# Patient Record
Sex: Female | Born: 1970
Health system: Southern US, Community
[De-identification: ages and names within clinical notes are randomized; demographics above are authoritative.]

## PROBLEM LIST (undated history)

## (undated) DIAGNOSIS — E663 Overweight: Secondary | ICD-10-CM

## (undated) DIAGNOSIS — R55 Syncope and collapse: Secondary | ICD-10-CM

## (undated) HISTORY — DX: Overweight: E66.3

## (undated) HISTORY — DX: Syncope and collapse: R55

---

## 1998-12-17 ENCOUNTER — Other Ambulatory Visit: Admission: RE | Admit: 1998-12-17 | Discharge: 1998-12-17 | Payer: Self-pay | Admitting: Gynecology

## 2000-02-16 ENCOUNTER — Other Ambulatory Visit: Admission: RE | Admit: 2000-02-16 | Discharge: 2000-02-16 | Payer: Self-pay | Admitting: Gynecology

## 2001-02-20 ENCOUNTER — Other Ambulatory Visit: Admission: RE | Admit: 2001-02-20 | Discharge: 2001-02-20 | Payer: Self-pay | Admitting: Gynecology

## 2001-11-08 ENCOUNTER — Encounter (INDEPENDENT_AMBULATORY_CARE_PROVIDER_SITE_OTHER): Payer: Self-pay | Admitting: Specialist

## 2001-11-08 ENCOUNTER — Inpatient Hospital Stay (HOSPITAL_COMMUNITY): Admission: AD | Admit: 2001-11-08 | Discharge: 2001-11-10 | Payer: Self-pay | Admitting: Gynecology

## 2001-11-14 ENCOUNTER — Encounter: Admission: RE | Admit: 2001-11-14 | Discharge: 2001-12-14 | Payer: Self-pay | Admitting: Gynecology

## 2001-12-15 ENCOUNTER — Encounter: Admission: RE | Admit: 2001-12-15 | Discharge: 2002-01-14 | Payer: Self-pay | Admitting: Gynecology

## 2001-12-27 ENCOUNTER — Other Ambulatory Visit: Admission: RE | Admit: 2001-12-27 | Discharge: 2001-12-27 | Payer: Self-pay | Admitting: Gynecology

## 2003-02-25 ENCOUNTER — Other Ambulatory Visit: Admission: RE | Admit: 2003-02-25 | Discharge: 2003-02-25 | Payer: Self-pay | Admitting: Gynecology

## 2003-09-27 ENCOUNTER — Inpatient Hospital Stay (HOSPITAL_COMMUNITY): Admission: AD | Admit: 2003-09-27 | Discharge: 2003-09-29 | Payer: Self-pay | Admitting: Gynecology

## 2003-11-24 ENCOUNTER — Other Ambulatory Visit: Admission: RE | Admit: 2003-11-24 | Discharge: 2003-11-24 | Payer: Self-pay | Admitting: Gynecology

## 2004-12-21 ENCOUNTER — Other Ambulatory Visit: Admission: RE | Admit: 2004-12-21 | Discharge: 2004-12-21 | Payer: Self-pay | Admitting: Gynecology

## 2007-07-19 ENCOUNTER — Ambulatory Visit (HOSPITAL_COMMUNITY): Admission: RE | Admit: 2007-07-19 | Discharge: 2007-07-19 | Payer: Self-pay | Admitting: Obstetrics and Gynecology

## 2010-08-19 NOTE — Discharge Summary (Signed)
   NAME:  Maria Peters, Maria Peters                        ACCOUNT NO.:  [id number]   MEDICAL RECORD NO.:  [id number]                   PATIENT TYPE:  INP   LOCATION:  9142                                 FACILITY:  [facility]   PHYSICIAN:  Timothy P. Fontaine, M.D.           DATE OF BIRTH:  08-09-1970   DATE OF ADMISSION:  11/08/2001  DATE OF DISCHARGE:  11/10/2001                                 DISCHARGE SUMMARY   DISCHARGE DIAGNOSES:  Intrauterine pregnancy at 40 weeks delivered, positive  group B Strep, status post spontaneous vaginal delivery on November 08, 2001 by  Dr. Colin Broach.   HISTORY OF PRESENT ILLNESS:  This 40 year old female gravida 1 with [facility]  November 07, 2001.  Prenatal course was uncomplicated.   HOSPITAL COURSE:  On November 08, 2001 patient admitted at 40 weeks with  rupture of membranes without contractions.  The patient was begun on IV  antibiotics for group B Strep prophylaxis and Pitocin was begun.  The  patient successfully on November 08, 2001 at 2203 underwent a spontaneous  vaginal delivery of a female Apgars of 9 and 9, weight of 7 pounds 3 ounces.  There was a midline episiotomy that was repaired without complications.  Postpartum patient remained afebrile, voiding, in stable condition.  She was  discharged home in satisfactory condition on November 10, 2001 and given  [facility] Gynecology postpartum instruction with postpartum booklet.   ACCESSORY CLINICAL FINDINGS:  Laboratories:  The patient's hemoglobin November 09, 2001 was 11.2.   DISPOSITION:  The patient was discharged to home to return in six weeks.  If  any problem prior to that time will be seen in the office.  Given a  prescription for Darvocet-N 100, # 20.     Susa Loffler, P.A.                    Timothy P. Audie Box, M.D.    TSG/MEDQ  D:  12/06/2001  T:  12/06/2001  Job:  [id number]

## 2010-08-30 ENCOUNTER — Other Ambulatory Visit (HOSPITAL_COMMUNITY): Payer: Self-pay | Admitting: Obstetrics and Gynecology

## 2010-08-30 DIAGNOSIS — Z1231 Encounter for screening mammogram for malignant neoplasm of breast: Secondary | ICD-10-CM

## 2010-09-08 ENCOUNTER — Ambulatory Visit (HOSPITAL_COMMUNITY)
Admission: RE | Admit: 2010-09-08 | Discharge: 2010-09-08 | Disposition: A | Discharge status: Home / Self Care | Payer: 59 | Source: Ambulatory Visit | Attending: Obstetrics and Gynecology | Admitting: Obstetrics and Gynecology

## 2010-09-08 DIAGNOSIS — Z1231 Encounter for screening mammogram for malignant neoplasm of breast: Secondary | ICD-10-CM | POA: Insufficient documentation

## 2013-01-29 ENCOUNTER — Other Ambulatory Visit: Payer: Self-pay | Admitting: Obstetrics and Gynecology

## 2013-01-29 DIAGNOSIS — Z1231 Encounter for screening mammogram for malignant neoplasm of breast: Secondary | ICD-10-CM

## 2013-02-14 ENCOUNTER — Ambulatory Visit (HOSPITAL_COMMUNITY): Payer: 59

## 2014-04-20 ENCOUNTER — Other Ambulatory Visit (HOSPITAL_COMMUNITY): Payer: Self-pay | Admitting: Obstetrics and Gynecology

## 2014-04-20 DIAGNOSIS — Z1231 Encounter for screening mammogram for malignant neoplasm of breast: Secondary | ICD-10-CM

## 2014-04-23 ENCOUNTER — Ambulatory Visit (HOSPITAL_COMMUNITY)
Admission: RE | Admit: 2014-04-23 | Discharge: 2014-04-23 | Disposition: A | Discharge status: Home / Self Care | Payer: 59 | Source: Ambulatory Visit | Attending: Obstetrics and Gynecology | Admitting: Obstetrics and Gynecology

## 2014-04-23 DIAGNOSIS — Z1231 Encounter for screening mammogram for malignant neoplasm of breast: Secondary | ICD-10-CM | POA: Diagnosis not present

## 2014-09-16 ENCOUNTER — Other Ambulatory Visit: Payer: Self-pay | Admitting: Dermatology

## 2015-06-09 DIAGNOSIS — H5213 Myopia, bilateral: Secondary | ICD-10-CM | POA: Diagnosis not present

## 2016-09-16 IMAGING — MG MM SCREENING BREAST TOMO BILAT
8 series · 9 of 24 positions shown · non-contrast
Comparison: Previous exam(s).

CLINICAL DATA: Screening.

EXAM:
DIGITAL SCREENING BILATERAL MAMMOGRAM WITH 3D TOMO WITH CAD

[L MLO]
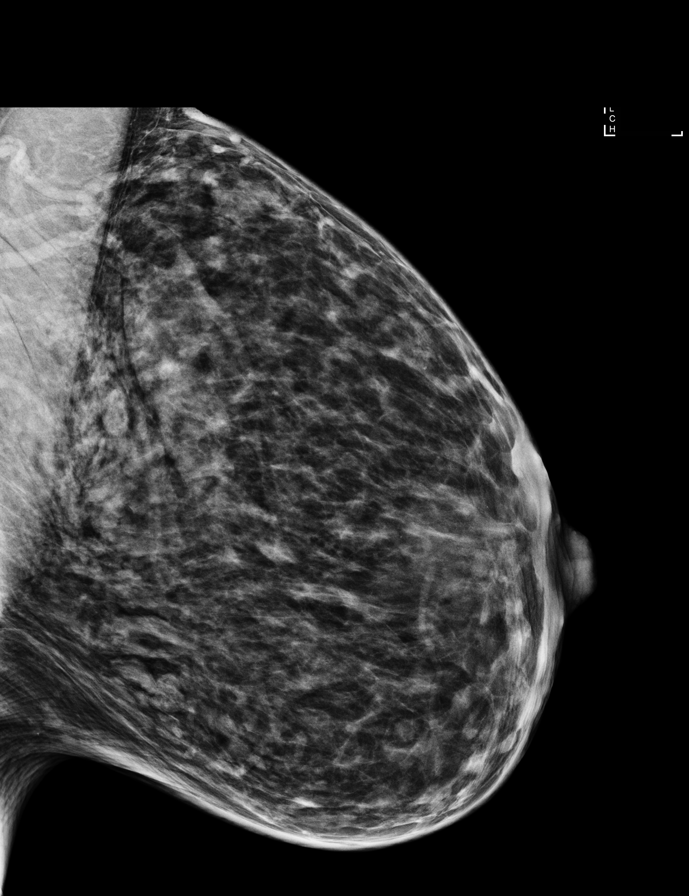

[L CC]
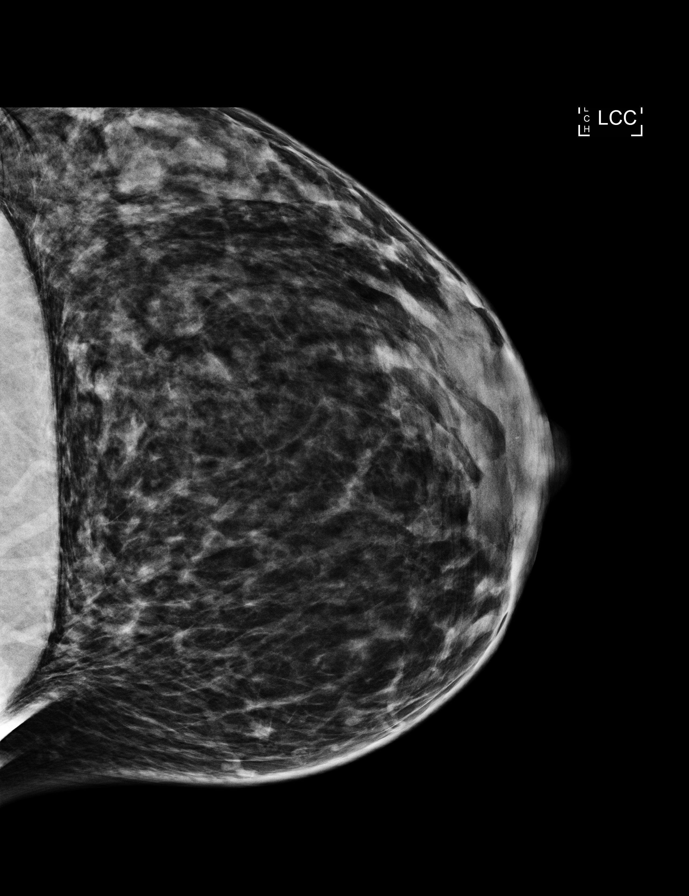

[R MLO]
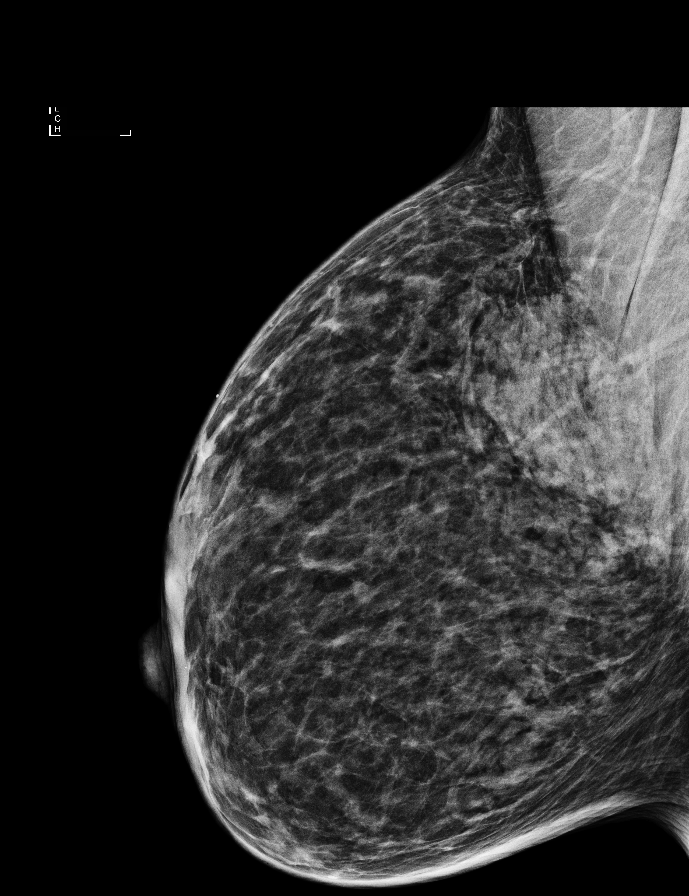

[R CC]
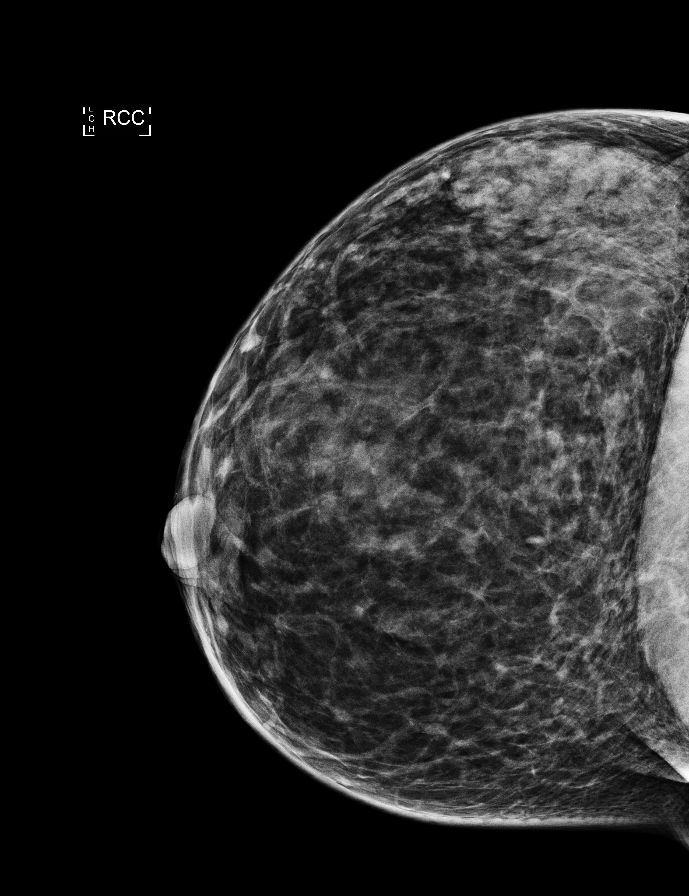

[R MLO tomo · 2 of 49 frames shown]
[frame 16/49]
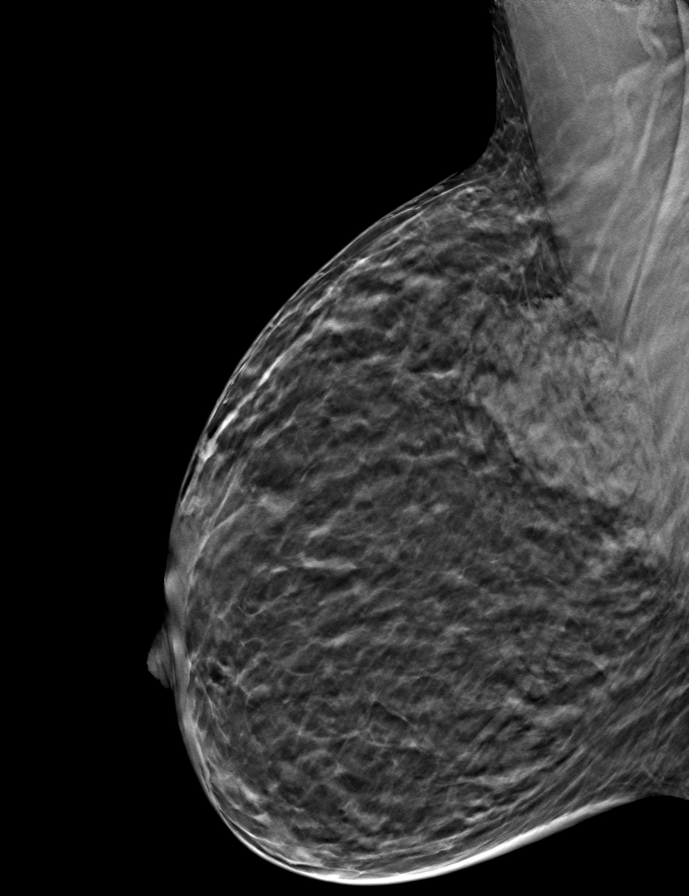
[frame 25/49]
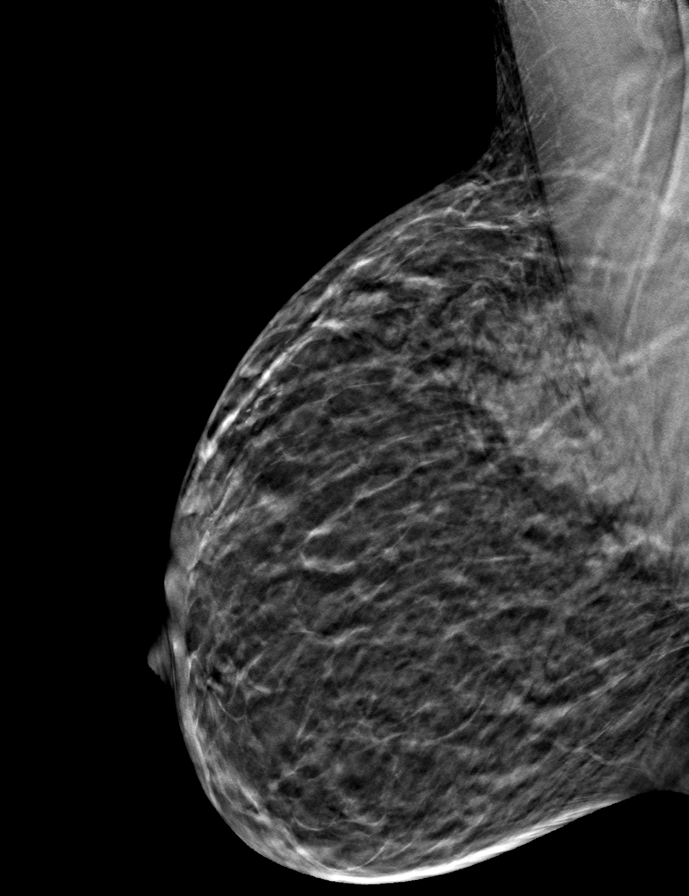

[L MLO tomo · tomo slice 26/51.0]
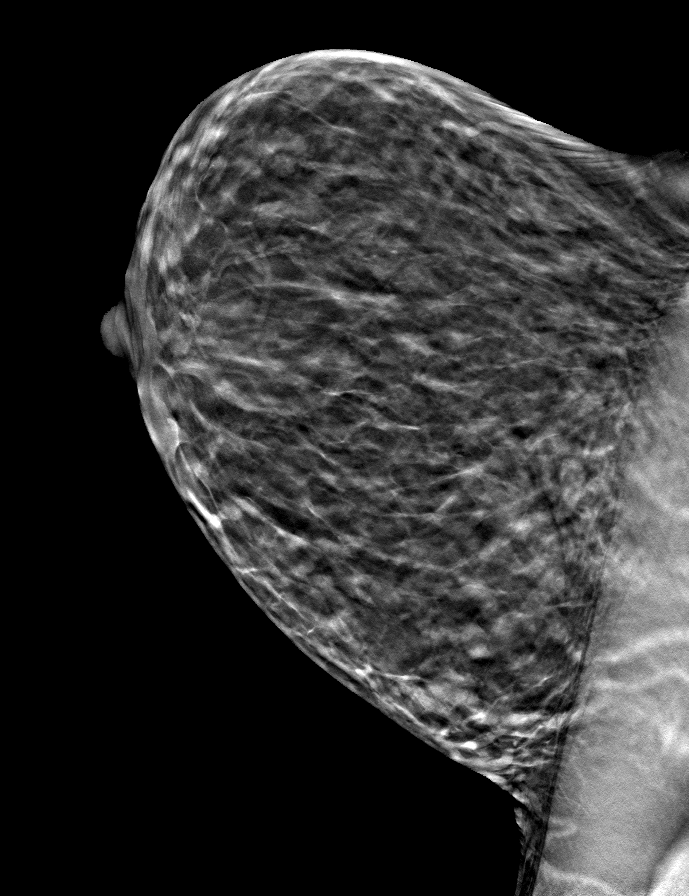

[L CC tomo · tomo slice 27/52.0]
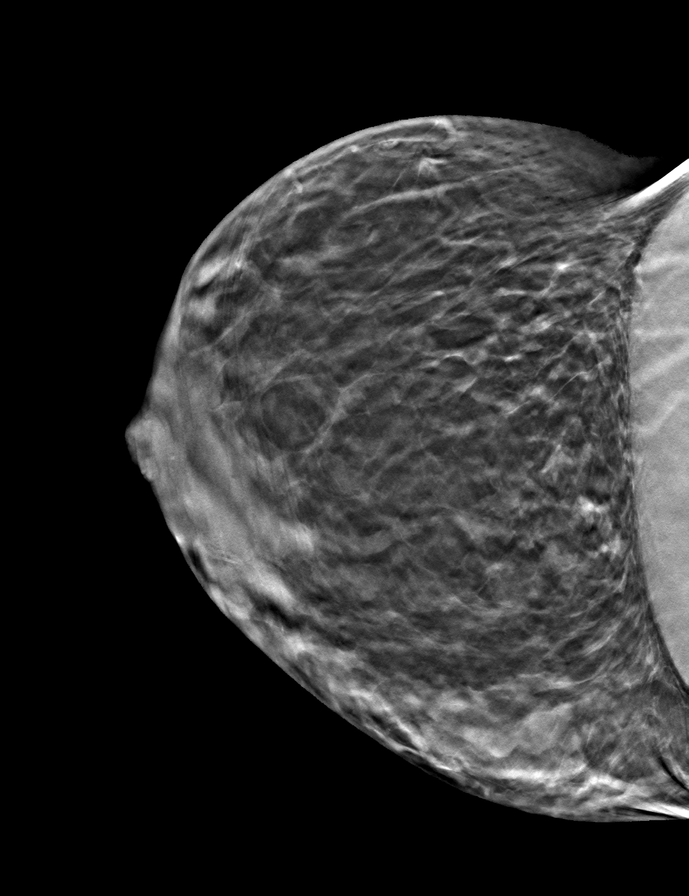

[R CC tomo · tomo slice 25/49.0]
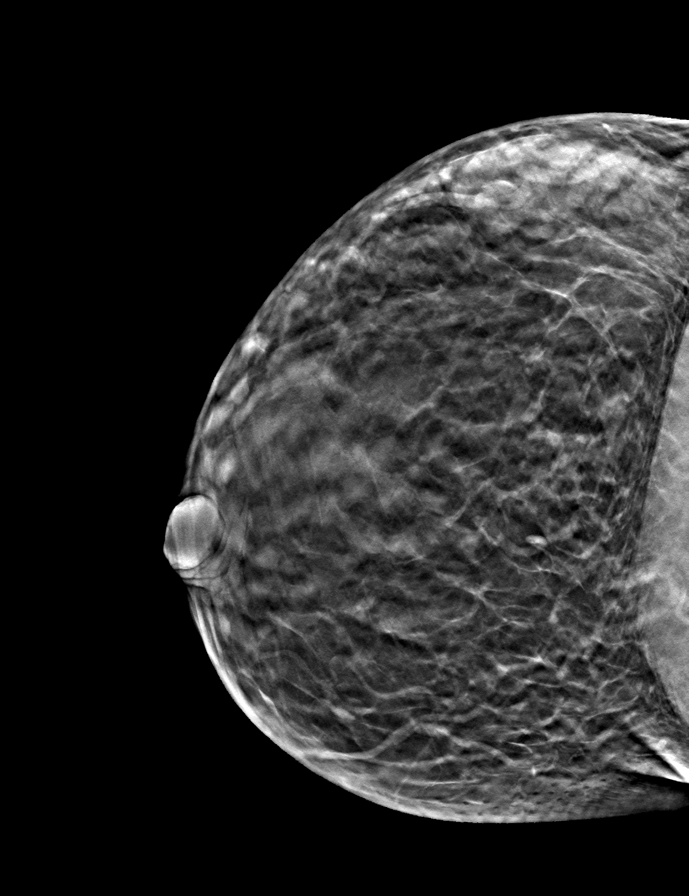

[9 of 24 positions shown; findings below may reference images not displayed]

ACR Breast Density Category c: The breast tissue is heterogeneously
dense, which may obscure small masses.
FINDINGS: There are no findings suspicious for malignancy. Images were
processed with CAD.
IMPRESSION: No mammographic evidence of malignancy. A result letter of this
screening mammogram will be mailed directly to the patient.

RECOMMENDATION:
Screening mammogram in one year. (Code:[id number])

BI-RADS CATEGORY  1: Negative.

## 2017-08-01 DIAGNOSIS — R001 Bradycardia, unspecified: Secondary | ICD-10-CM | POA: Diagnosis not present

## 2017-08-01 DIAGNOSIS — Z3040 Encounter for surveillance of contraceptives, unspecified: Secondary | ICD-10-CM | POA: Diagnosis not present

## 2017-08-01 DIAGNOSIS — Z01411 Encounter for gynecological examination (general) (routine) with abnormal findings: Secondary | ICD-10-CM | POA: Diagnosis not present

## 2017-08-01 DIAGNOSIS — R55 Syncope and collapse: Secondary | ICD-10-CM | POA: Diagnosis not present

## 2017-08-01 DIAGNOSIS — Z124 Encounter for screening for malignant neoplasm of cervix: Secondary | ICD-10-CM | POA: Diagnosis not present

## 2017-08-01 DIAGNOSIS — Z6826 Body mass index (BMI) 26.0-26.9, adult: Secondary | ICD-10-CM | POA: Diagnosis not present

## 2017-08-02 ENCOUNTER — Encounter: Payer: Self-pay | Admitting: Cardiology

## 2017-08-02 DIAGNOSIS — E663 Overweight: Secondary | ICD-10-CM

## 2017-08-02 HISTORY — DX: Overweight: E66.3

## 2017-08-03 ENCOUNTER — Ambulatory Visit: Payer: 59 | Admitting: Cardiology

## 2017-08-11 ENCOUNTER — Encounter: Payer: Self-pay | Admitting: Nurse Practitioner

## 2017-08-14 ENCOUNTER — Other Ambulatory Visit: Payer: Self-pay

## 2017-08-29 ENCOUNTER — Ambulatory Visit: Payer: 59 | Admitting: Cardiology

## 2017-08-29 ENCOUNTER — Encounter: Payer: Self-pay | Admitting: Cardiology

## 2017-08-29 VITALS — BP 122/70 | HR 54 | Ht 67.0 in | Wt 172.0 lb

## 2017-08-29 DIAGNOSIS — R079 Chest pain, unspecified: Secondary | ICD-10-CM

## 2017-08-29 DIAGNOSIS — R55 Syncope and collapse: Secondary | ICD-10-CM | POA: Insufficient documentation

## 2017-08-29 NOTE — Patient Instructions (Signed)
Medication Instructions:  Your physician recommends that you continue on your current medications as directed. Please refer to the Current Medication list given to you today.  Labwork: Your physician recommends that you have the following labs drawn: BMP, CBC, TSH, liver and lipid panel. Please come to the office fasting Monday through Friday no appointment necessary to have labs completed.  Testing/Procedures: Your physician has requested that you have a stress echocardiogram. For further information please visit [email or web address] Please follow instruction sheet as given.  Your physician has recommended that you wear a holter monitor. Holter monitors are medical devices that record the heart's electrical activity. Doctors most often use these monitors to diagnose arrhythmias. Arrhythmias are problems with the speed or rhythm of the heartbeat. The monitor is a small, portable device. You can wear one while you do your normal daily activities. This is usually used to diagnose what is causing palpitations/syncope (passing out).  Follow-Up: Your physician recommends that you schedule a follow-up appointment in: 2 months  Any Other Special Instructions Will Be Listed Below (If Applicable).     If you need a refill on your cardiac medications before your next appointment, please call your pharmacy.   [facility]  Garey Ham, RN, BSN   Cardiopulmonary Exercise Stress Test Cardiopulmonary exercise testing (CPET) is a test that checks how your heart and lungs react to exercise. This is called your exercise capacity. During this test, you will walk or run on a treadmill or pedal on a stationary bike while tests are done on your heart and lungs. You may have this test to:  See why you are short of breath.  Check for exercise intolerance.  See how your lungs work.  See how your heart works.  Check for how you are responding to a heart or lung rehabilitation program.  See if you have  a heart or lung problem.  See if you are healthy enough to have surgery.  What happens before the procedure?  Follow instructions from your doctor about what you cannot eat or drink.  Ask your doctor about changing or stopping your normal medicines. This is important if you take diabetes medicines or blood thinners.  Wear loose, comfortable clothing and shoes.  If you use an inhaler, bring it with you to the test. What happens during the procedure?  A blood pressure cuff will be placed on your arm.  Several stick-on patches (electrodes) will be placed on your chest. They will be attached to an electrocardiogram (EKG) machine.  A clip-on monitor that measures the amount of oxygen in your blood will be placed on your finger (pulse oximeter).  A clip will be placed on your nose and a mouthpiece will be placed in your mouth. This may be held in place with a headpiece. You will breathe through the mouthpiece during the test.  You will be asked to start exercising. You will be closely watched while you exercise.  The amount of effort for your exercise will be gradually increased.  During exercise, the test will measure: ? Your heart rate. ? Your heart rhythm. ? Your oxygen blood level. ? The amount of oxygen and carbon dioxide that you breathe out.  The test will end when: ? You have finished the test. ? You have reached your maximum ability to exercise. ? You have chest or leg pain, dizziness, or shortness of breath. The procedure may vary among doctors and hospitals. What happens after the procedure?  Your blood pressure and EKG  will be checked to watch how you recover from the test. This information is not intended to replace advice given to you by your health care provider. Make sure you discuss any questions you have with your health care provider. Document Released: 03/08/2009 Document Revised: 08/10/2015 Document Reviewed: 02/01/2015 Elsevier Interactive Patient Education   2018 [employer].  Holter Monitoring A Holter monitor is a small device that is used to detect abnormal heart rhythms. It clips to your clothing and is connected by wires to flat, sticky disks (electrodes) that attach to your chest. It is worn continuously for 24-48 hours. Follow these instructions at home:  Wear your Holter monitor at all times, even while exercising and sleeping, for as long as directed by your health care provider.  Make sure that the Holter monitor is safely clipped to your clothing or close to your body as recommended by your health care provider.  Do not get the monitor or wires wet.  Do not put body lotion or moisturizer on your chest.  Keep your skin clean.  Keep a diary of your daily activities, such as walking and doing chores. If you feel that your heartbeat is abnormal or that your heart is fluttering or skipping a beat: ? Record what you are doing when it happens. ? Record what time of day the symptoms occur.  Return your Holter monitor as directed by your health care provider.  Keep all follow-up visits as directed by your health care provider. This is important. Get help right away if:  You feel lightheaded or you faint.  You have trouble breathing.  You feel pain in your chest, upper arm, or jaw.  You feel sick to your stomach and your skin is pale, cool, or damp.  You heartbeat feels unusual or abnormal. This information is not intended to replace advice given to you by your health care provider. Make sure you discuss any questions you have with your health care provider. Document Released: 12/17/2003 Document Revised: 08/26/2015 Document Reviewed: 10/27/2013 Elsevier Interactive Patient Education  [employer].

## 2017-08-29 NOTE — Progress Notes (Signed)
Cardiology Office Note:    Date:  08/29/2017   ID:  Maria Peters, DOB [birth year], MRN [id number]  PCP:  Patient, No Pcp Per  Cardiologist:  Garwin Brothers, MD   Referring MD: Hal Morales, MD    ASSESSMENT:    1. Micturition syncope   2. Chest pain, unspecified type    PLAN:    In order of problems listed above:  1. I discussed my findings with the patient at extensive length and reassured her.  I think she is in excellent health.  I believe this was an episode of micturition syncope.  In view of the issues relating to bradycardia it might be physiological however because of the concern I will do a 48-hour Holter monitor.  She has not had blood work in the past and therefore we will do complete blood work including lipids and thyroid testing. 2. Her chest pain is atypical and in view of low risk factors we will do a stress echo and this will help [place] assess her heart rate and her chronotropic competence to exercise, cardiac anatomy and her conditioning to exercise. 3. Patient will be seen in follow-up appointment in 6 months or earlier if the patient has any concerns 4. Fall precautions were advised to the patient.   Medication Adjustments/Labs and Tests Ordered: Current medicines are reviewed at length with the patient today.  Concerns regarding medicines are outlined above.  Orders Placed This Encounter  Procedures  . Basic metabolic panel  . CBC with Differential/Platelet  . TSH  . Hepatic function panel  . Lipid panel  . HOLTER MONITOR - 48 HOUR  . EKG 12-Lead  . ECHOCARDIOGRAM STRESS TEST   No orders of the defined types were placed in this encounter.    History of Present Illness:    Maria Peters is a 47 y.o. female who is being seen today for the evaluation of micturition syncope and bradycardia at the request of Haygood, Maris Berger, MD.  Patient is a pleasant 47 year old female.  She is otherwise in good health.  She denies any history of diabetes  mellitus hypertension dyslipidemia or any such problems.  She is a very active lady exercises at the gym on a regular basis.  She mentions to me that she woke up at 1 AM in the early hours of the morning and used her bathroom and subsequently had a passing out spell.  She has never experienced this before.  This is happened only once and subsequently she has done fine.  She was noted to have bradycardia by her primary care providers and was concerned about this and sent here for evaluation.  She has never had any other episodes of dizziness or syncope and again she is a very active lady.  She occasionally complains of chest discomfort pinprick-like sensation with no radiation or relation to exertion.  At the time of my evaluation, the patient is alert awake oriented and in no distress.  Past Medical History:  Diagnosis Date  . Overweight (BMI 25.0-29.9) 08/02/2017  . Syncope     History reviewed. No pertinent surgical history.  Current Medications: Current Meds  Medication Sig  . Biotin 21308 MCG TABS Take 1 tablet by mouth daily.  Marland Kitchen levonorgestrel (MIRENA) 20 MCG/24HR IUD 1 each by Intrauterine route once.  . Multiple Vitamin (MULTIVITAMIN) tablet Take 1 tablet by mouth daily.  Marland Kitchen nystatin-triamcinolone ointment (MYCOLOG) Apply 1 application topically 2 (two) times daily as needed. Use sparingly on affected  areas twice daily as needed  . Probiotic Product (PROBIOTIC ADVANCED PO) Take 4 capsules by mouth daily.     Allergies:   Patient has no known allergies.   Social History   Socioeconomic History  . Marital status: Single    Spouse name: Not on file  . Number of children: Not on file  . Years of education: Not on file  . Highest education level: Not on file  Occupational History  . Not on file  Social Needs  . Financial resource strain: Not on file  . Food insecurity:    Worry: Not on file    Inability: Not on file  . Transportation needs:    Medical: Not on file     Non-medical: Not on file  Tobacco Use  . Smoking status: Never Smoker  . Smokeless tobacco: Never Used  Substance and Sexual Activity  . Alcohol use: Yes    Comment: Occasional use  . Drug use: Not on file  . Sexual activity: Not on file  Lifestyle  . Physical activity:    Days per week: Not on file    Minutes per session: Not on file  . Stress: Not on file  Relationships  . Social connections:    Talks on phone: Not on file    Gets together: Not on file    Attends religious service: Not on file    Active member of club or organization: Not on file    Attends meetings of clubs or organizations: Not on file    Relationship status: Not on file  Other Topics Concern  . Not on file  Social History Narrative  . Not on file     Family History: The patient's family history includes Chronic Renal Failure in her mother; Diabetes in her mother; Hypertension in her mother.  ROS:   Please see the history of present illness.    All other systems reviewed and are negative.  EKGs/Labs/Other Studies Reviewed:    The following studies were reviewed today: EKG reveals sinus rhythm and nonspecific ST-T changes   Recent Labs: No results found for requested labs within last 8760 hours.  Recent Lipid Panel No results found for: CHOL, TRIG, HDL, CHOLHDL, VLDL, LDLCALC, LDLDIRECT  Physical Exam:    VS:  BP 122/70 (BP Location: Right Arm, Patient Position: Sitting, Cuff Size: Normal)   Pulse (!) 54   Ht  (1.702 m)   Wt 172 lb (78 kg)   SpO2 98%   BMI 26.94 kg/m     Wt Readings from Last 3 Encounters:  08/29/17 172 lb (78 kg)     GEN: Patient is in no acute distress HEENT: Normal NECK: No JVD; No carotid bruits LYMPHATICS: No lymphadenopathy CARDIAC: S1 S2 regular, 2/6 systolic murmur at the apex. RESPIRATORY:  Clear to auscultation without rales, wheezing or rhonchi  ABDOMEN: Soft, non-tender, non-distended MUSCULOSKELETAL:  No edema; No deformity  SKIN: Warm and  dry NEUROLOGIC:  Alert and oriented x 3 PSYCHIATRIC:  Normal affect    Signed, Garwin Brothers, MD  08/29/2017 9:25 AM    [facility]

## 2017-09-06 DIAGNOSIS — R079 Chest pain, unspecified: Secondary | ICD-10-CM | POA: Diagnosis not present

## 2017-09-06 DIAGNOSIS — R55 Syncope and collapse: Secondary | ICD-10-CM | POA: Diagnosis not present

## 2017-09-07 ENCOUNTER — Telehealth: Payer: Self-pay

## 2017-09-07 DIAGNOSIS — R7989 Other specified abnormal findings of blood chemistry: Secondary | ICD-10-CM

## 2017-09-07 LAB — BASIC METABOLIC PANEL
BUN / CREAT RATIO: 22 (ref 9–23)
BUN: 15 mg/dL (ref 6–24)
CO2: 22 mmol/L (ref 20–29)
CREATININE: 0.67 mg/dL (ref 0.57–1.00)
Calcium: 9.1 mg/dL (ref 8.7–10.2)
Chloride: 103 mmol/L (ref 96–106)
GFR, EST AFRICAN AMERICAN: 121 mL/min/{1.73_m2} (ref >59)
GFR, EST NON AFRICAN AMERICAN: 105 mL/min/{1.73_m2} (ref >59)
Glucose: 67 mg/dL (ref 65–99)
POTASSIUM: 4.2 mmol/L (ref 3.5–5.2)
SODIUM: 138 mmol/L (ref 134–144)

## 2017-09-07 LAB — CBC WITH DIFFERENTIAL/PLATELET
Basophils Absolute: 0 10*3/uL (ref 0.0–0.2)
Basos: 0 %
EOS (ABSOLUTE): 0 10*3/uL (ref 0.0–0.4)
EOS: 1 %
HEMATOCRIT: 37.1 % (ref 34.0–46.6)
Hemoglobin: 12.2 g/dL (ref 11.1–15.9)
IMMATURE GRANULOCYTES: 0 %
Immature Grans (Abs): 0 10*3/uL (ref 0.0–0.1)
LYMPHS ABS: 1.8 10*3/uL (ref 0.7–3.1)
Lymphs: 36 %
MCH: 31.5 pg (ref 26.6–33.0)
MCHC: 32.9 g/dL (ref 31.5–35.7)
MCV: 96 fL (ref 79–97)
MONOS ABS: 0.3 10*3/uL (ref 0.1–0.9)
Monocytes: 6 %
NEUTROS PCT: 57 %
Neutrophils Absolute: 2.9 10*3/uL (ref 1.4–7.0)
Platelets: 249 10*3/uL (ref 150–450)
RBC: 3.87 x10E6/uL (ref 3.77–5.28)
RDW: 13.8 % (ref 12.3–15.4)
WBC: 5.1 10*3/uL (ref 3.4–10.8)

## 2017-09-07 LAB — LIPID PANEL
CHOL/HDL RATIO: 2.5 ratio (ref 0.0–4.4)
Cholesterol, Total: 171 mg/dL (ref 100–199)
HDL: 68 mg/dL (ref >39)
LDL Calculated: 93 mg/dL (ref 0–99)
TRIGLYCERIDES: 48 mg/dL (ref 0–149)
VLDL Cholesterol Cal: 10 mg/dL (ref 5–40)

## 2017-09-07 LAB — HEPATIC FUNCTION PANEL
ALBUMIN: 4 g/dL (ref 3.5–5.5)
ALT: 24 IU/L (ref 0–32)
AST: 23 IU/L (ref 0–40)
Alkaline Phosphatase: 57 IU/L (ref 39–117)
Bilirubin Total: 0.3 mg/dL (ref 0.0–1.2)
Bilirubin, Direct: 0.11 mg/dL (ref 0.00–0.40)
Total Protein: 7.5 g/dL (ref 6.0–8.5)

## 2017-09-07 LAB — TSH: TSH: 0.213 u[IU]/mL — ABNORMAL LOW (ref 0.450–4.500)

## 2017-09-07 NOTE — Telephone Encounter (Signed)
Left message to return call on home and mobile number. Called work number and stated patient was off until Monday. Will discuss lab results and confirm if patient has primary care physician when call returned.

## 2017-09-07 NOTE — Addendum Note (Signed)
Addended by: Lamona CurlOUTH, Kamerin Axford H on: 09/07/2017 09:16 AM   Modules accepted: Orders

## 2017-09-07 NOTE — Telephone Encounter (Signed)
Patient notified of lab results per Dr Tomie Chinaevankar.  Patient advised of normal results, but she will need to have thyroid evaluated due to low TSH level.  Patient does not have a PCP.  Patient was [facility] to [facility] to establish with primary care physician.  Patient was given phone number to contact the office.  Patient verbalized understanding.  Referral entered.

## 2017-09-07 NOTE — Telephone Encounter (Signed)
-----   Message from Garwin Brothersajan R Revankar, MD sent at 09/07/2017  8:27 AM EDT ----- Please inform patient.  Overall labs are fine but her thyroid needs evaluation.  Please send copy to primary care physician and call their office.  Please have the patient also get in touch with her medical doctor to discuss this to see if any further evaluation is necessary.  I will discuss in detail at next appointment. Cc  primary care/referring physician Garwin Brothersajan R Revankar, MD 09/07/2017 8:26 AM

## 2017-09-26 ENCOUNTER — Other Ambulatory Visit (HOSPITAL_BASED_OUTPATIENT_CLINIC_OR_DEPARTMENT_OTHER): Payer: 59

## 2017-09-27 ENCOUNTER — Ambulatory Visit: Payer: 59 | Admitting: Family Medicine

## 2017-09-27 ENCOUNTER — Encounter: Payer: Self-pay | Admitting: Family Medicine

## 2017-09-27 VITALS — BP 112/64 | HR 51 | Temp 98.6°F | Ht 67.0 in | Wt 177.1 lb

## 2017-09-27 DIAGNOSIS — Z1231 Encounter for screening mammogram for malignant neoplasm of breast: Secondary | ICD-10-CM | POA: Diagnosis not present

## 2017-09-27 DIAGNOSIS — Z0000 Encounter for general adult medical examination without abnormal findings: Secondary | ICD-10-CM

## 2017-09-27 DIAGNOSIS — R7989 Other specified abnormal findings of blood chemistry: Secondary | ICD-10-CM | POA: Diagnosis not present

## 2017-09-27 DIAGNOSIS — Z114 Encounter for screening for human immunodeficiency virus [HIV]: Secondary | ICD-10-CM | POA: Diagnosis not present

## 2017-09-27 DIAGNOSIS — Z23 Encounter for immunization: Secondary | ICD-10-CM | POA: Diagnosis not present

## 2017-09-27 DIAGNOSIS — Z1239 Encounter for other screening for malignant neoplasm of breast: Secondary | ICD-10-CM

## 2017-09-27 LAB — TSH: TSH: 0.43 u[IU]/mL (ref 0.35–4.50)

## 2017-09-27 LAB — T4, FREE: FREE T4: 1.35 ng/dL (ref 0.60–1.60)

## 2017-09-27 NOTE — Progress Notes (Signed)
Pre visit review using our clinic review tool, if applicable. No additional management support is needed unless otherwise documented below in the visit note. 

## 2017-09-27 NOTE — Patient Instructions (Addendum)
Give [place] 2-3 business days to get the results of your labs back. If labs are normal, you will likely receive a letter in the mail unless you have MyChart. This can take longer than 2-3 business days.   When you exercise, consider lifting weights and mixing up your workouts.  Let us know if you need anything.  Healthy Eating Plan Many factors influence your heart health, including eating and exercise habits. Heart (coronary) risk increases with abnormal blood fat (lipid) levels. Heart-healthy meal planning includes limiting unhealthy fats, increasing healthy fats, and making other small dietary changes. This includes maintaining a healthy body weight to help keep lipid levels within a normal range.  WHAT IS MY PLAN?  Your health care provider recommends that you:  Drink a glass of water before meals to help with satiety.  Eat slowly.  An alternative to the water is to add Metamucil. This will help with satiety as well. It does contain calories, unlike water.  WHAT TYPES OF FAT SHOULD I CHOOSE?  Choose healthy fats more often. Choose monounsaturated and polyunsaturated fats, such as olive oil and canola oil, flaxseeds, walnuts, almonds, and seeds.  Eat more omega-3 fats. Good choices include salmon, mackerel, sardines, tuna, flaxseed oil, and ground flaxseeds. Aim to eat fish at least two times each week.  Avoid foods with partially hydrogenated oils in them. These contain trans fats. Examples of foods that contain trans fats are stick margarine, some tub margarines, cookies, crackers, and other baked goods. If you are going to avoid a fat, this is the one to avoid!  WHAT GENERAL GUIDELINES DO I NEED TO FOLLOW?  Check food labels carefully to identify foods with trans fats. Avoid these types of options when possible.  Fill one half of your plate with vegetables and green salads. Eat 4-5 servings of vegetables per day. A serving of vegetables equals 1 cup of raw leafy vegetables,  cup of  raw or cooked cut-up vegetables, or  cup of vegetable juice.  Fill one fourth of your plate with whole grains. Look for the word "whole" as the first word in the ingredient list.  Fill one fourth of your plate with lean protein foods.  Eat 4-5 servings of fruit per day. A serving of fruit equals one medium whole fruit,  cup of dried fruit,  cup of fresh, frozen, or canned fruit. Try to avoid fruits in cups/syrups as the sugar content can be high.  Eat more foods that contain soluble fiber. Examples of foods that contain this type of fiber are apples, broccoli, carrots, beans, peas, and barley. Aim to get 20-30 g of fiber per day.  Eat more home-cooked food and less restaurant, buffet, and fast food.  Limit or avoid alcohol.  Limit foods that are high in starch and sugar.  Avoid fried foods when able.  Cook foods by using methods other than frying. Baking, boiling, grilling, and broiling are all great options. Other fat-reducing suggestions include: ? Removing the skin from poultry. ? Removing all visible fats from meats. ? Skimming the fat off of stews, soups, and gravies before serving them. ? Steaming vegetables in water or broth.  Lose weight if you are overweight. Losing just 5-10% of your initial body weight can help your overall health and prevent diseases such as diabetes and heart disease.  Increase your consumption of nuts, legumes, and seeds to 4-5 servings per week. One serving of dried beans or legumes equals  cup after being cooked, one serving  of nuts equals 1 ounces, and one serving of seeds equals  ounce or 1 tablespoon.  WHAT ARE GOOD FOODS CAN I EAT? Grains Grainy breads (try to find bread that is 3 g of fiber per slice or greater), oatmeal, light popcorn. Whole-grain cereals. Rice and pasta, including brown rice and those that are made with whole wheat. Edamame pasta is a great alternative to grain pasta. It has a higher protein content. Try to avoid significant  consumption of white bread, sugary cereals, or pastries/baked goods.  Vegetables All vegetables. Cooked white potatoes do not count as vegetables.  Fruits All fruits, but limit pineapple and bananas as these fruits have a higher sugar content.  Meats and Other Protein Sources Lean, well-trimmed beef, veal, pork, and lamb. Chicken and [place] without skin. All fish and shellfish. Wild duck, rabbit, pheasant, and venison. Egg whites or low-cholesterol egg substitutes. Dried beans, peas, lentils, and tofu.Seeds and most nuts.  Dairy Low-fat or nonfat cheeses, including ricotta, string, and mozzarella. Skim or 1% milk that is liquid, powdered, or evaporated. Buttermilk that is made with low-fat milk. Nonfat or low-fat yogurt. Soy/Almond milk are good alternatives if you cannot handle dairy.  Beverages Water is the best for you. Sports drinks with less sugar are more desirable unless you are a highly active athlete.  Sweets and Desserts Sherbets and fruit ices. Honey, jam, marmalade, jelly, and syrups. Dark chocolate.  Eat all sweets and desserts in moderation.  Fats and Oils Nonhydrogenated (trans-free) margarines. Vegetable oils, including soybean, sesame, sunflower, olive, peanut, safflower, corn, canola, and cottonseed. Salad dressings or mayonnaise that are made with a vegetable oil. Limit added fats and oils that you use for cooking, baking, salads, and as spreads.  Other Cocoa powder. Coffee and tea. Most condiments.  The items listed above may not be a complete list of recommended foods or beverages. Contact your dietitian for more options.

## 2017-09-27 NOTE — Progress Notes (Signed)
Chief Complaint  Patient presents with  . New Patient (Initial Visit)     Well Woman Maria Peters is here for a complete physical.   Her last physical was >1 year ago.  Current diet: in general, a "healthy" diet. Current exercise: 3-4x/week- elliptical, some lifting Weight is up a little over past couple mo and she denies daytime fatigue. No LMP recorded. (Menstrual status: IUD). Unsure when mom when thru menopause.  Seatbelt? Yes  Health Maintenance Pap/HPV- Yes Mammogram- Yes Tetanus- No HIV screening- No  Past Medical History:  Diagnosis Date  . Overweight (BMI 25.0-29.9) 08/02/2017  . Syncope      History reviewed. No pertinent surgical history.  Medications  Current Outpatient Medications on File Prior to Visit  Medication Sig Dispense Refill  . Biotin 1610910000 MCG TABS Take 1 tablet by mouth daily.    Marland Kitchen. levonorgestrel (MIRENA) 20 MCG/24HR IUD 1 each by Intrauterine route once.    . Multiple Vitamin (MULTIVITAMIN) tablet Take 1 tablet by mouth daily.     Allergies No Known Allergies  Review of Systems: Constitutional:  +weight gain Eye:  no recent significant change in vision Ear/Nose/Mouth/Throat:  Ears:  no tinnitus or vertigo and no recent change in hearing Nose/Mouth/Throat:  no complaints of nasal congestion, no sore throat Cardiovascular: no chest pain Respiratory:  no cough and no shortness of breath Gastrointestinal:  no abdominal pain, no change in bowel habits GU:  Female: negative for dysuria or pelvic pain Musculoskeletal/Extremities:  no pain of the joints Integumentary (Skin/Breast):  no abnormal skin lesions reported Neurologic:  no headaches Endocrine:  denies fatigue Hematologic/Lymphatic:  No areas of easy bleeding  Exam BP 112/64 (BP Location: Left Arm, Patient Position: Sitting, Cuff Size: Normal)   Pulse (!) 51   Temp 98.6 F (37 C) (Oral)   Ht 5\' 7"  (1.702 m)   Wt 177 lb 2 oz (80.3 kg)   SpO2 98%   BMI 27.74 kg/m  General:  well  developed, well nourished, in no apparent distress Skin:  no significant moles, warts, or growths Head:  no masses, lesions, or tenderness Eyes:  pupils equal and round, sclera anicteric without injection Ears:  canals without lesions, TMs shiny without retraction, no obvious effusion, no erythema Nose:  nares patent, septum midline, mucosa normal, and no drainage or sinus tenderness Throat/Pharynx:  lips and gingiva without lesion; tongue and uvula midline; non-inflamed pharynx; no exudates or postnasal drainage Neck: neck supple without adenopathy, thyromegaly, or masses Lungs:  clear to auscultation, breath sounds equal bilaterally, no respiratory distress Cardio: bradycardic, regular rhythm, no bruits, no LE edema Abdomen:  abdomen soft, nontender; bowel sounds normal; no masses or organomegaly Genital: Defer to GYN Musculoskeletal:  symmetrical muscle groups noted without atrophy or deformity Extremities:  no clubbing, cyanosis, or edema, no deformities, no skin discoloration Neuro:  gait normal; deep tendon reflexes normal and symmetric Psych: well oriented with normal range of affect and appropriate judgment/insight  Assessment and Plan  Well adult exam  Screening breast examination - Plan: MM DIGITAL SCREENING BILATERAL  Screening for HIV (human immunodeficiency virus) - Plan: HIV antibody  Low TSH level - Plan: TSH, T4, free   Well 47 y.o. female. Counseled on diet and exercise. Healthy diet handout given. I question whether she is going through menopause with the weight gain. Change up portions and workouts.  Other orders as above. Follow up in 1 yr or prn. The patient voiced understanding and agreement to the plan.  Jilda Roche [place], DO 09/27/17 9:42 AM

## 2017-09-27 NOTE — Addendum Note (Signed)
Addended by: Scharlene GlossEWING, ROBIN B on: 09/27/2017 09:51 AM   Modules accepted: Orders

## 2017-09-28 ENCOUNTER — Other Ambulatory Visit (HOSPITAL_BASED_OUTPATIENT_CLINIC_OR_DEPARTMENT_OTHER): Payer: 59

## 2017-09-28 LAB — HIV ANTIBODY (ROUTINE TESTING W REFLEX): HIV 1&2 Ab, 4th Generation: NONREACTIVE

## 2017-10-15 ENCOUNTER — Ambulatory Visit (HOSPITAL_BASED_OUTPATIENT_CLINIC_OR_DEPARTMENT_OTHER)
Admission: RE | Admit: 2017-10-15 | Discharge: 2017-10-15 | Disposition: A | Discharge status: Home / Self Care | Payer: 59 | Source: Ambulatory Visit | Attending: Cardiology | Admitting: Cardiology

## 2017-10-15 ENCOUNTER — Ambulatory Visit: Payer: 59

## 2017-10-15 DIAGNOSIS — R079 Chest pain, unspecified: Secondary | ICD-10-CM | POA: Insufficient documentation

## 2017-10-15 DIAGNOSIS — R55 Syncope and collapse: Secondary | ICD-10-CM | POA: Insufficient documentation

## 2017-10-15 NOTE — Progress Notes (Signed)
  Echocardiogram Echocardiogram Stress Test has been performed.  Maria Peters T [name] 10/15/2017, 11:11 AM

## 2017-10-29 ENCOUNTER — Encounter: Payer: Self-pay | Admitting: Cardiology

## 2017-10-29 ENCOUNTER — Ambulatory Visit: Payer: 59 | Admitting: Cardiology

## 2017-10-29 VITALS — BP 114/62 | HR 79 | Ht 67.0 in | Wt 173.8 lb

## 2017-10-29 DIAGNOSIS — R55 Syncope and collapse: Secondary | ICD-10-CM

## 2017-10-29 NOTE — Progress Notes (Signed)
Cardiology Office Note:    Date:  10/29/2017   ID:  Maria Peters, DOB 1970-08-09, MRN [id number]  PCP:  Sharlene Dory, DO  Cardiologist:  Garwin Brothers, MD   Referring MD: No ref. provider found    ASSESSMENT:    1. Micturition syncope    PLAN:    In order of problems listed above:  1. Primary prevention stressed with patient.  Importance of compliance with diet and medications stressed and she vocalized understanding.  Her blood pressure is stable.  I told her to keep herself well-hydrated especially hot summer months especially if she is working in a hot environment.  She vocalized understanding. 2. She will be seen in follow-up appointment on a as needed basis only.  Results of the testing was discussed with her at length and questions were answered to her satisfaction.   Medication Adjustments/Labs and Tests Ordered: Current medicines are reviewed at length with the patient today.  Concerns regarding medicines are outlined above.  No orders of the defined types were placed in this encounter.  No orders of the defined types were placed in this encounter.    Chief Complaint  Patient presents with  . 2 month follow up     History of Present Illness:    Maria Peters is a 47 y.o. female.  The patient was evaluated by me for a syncopal episode suggesting micturition syncope.  She denies any problems at this time and takes care of activities of daily living.  She has begun exercising on a regular basis.  No chest pain orthopnea or PND.  At the time of my evaluation, the patient is alert awake oriented and in no distress.  Past Medical History:  Diagnosis Date  . Overweight (BMI 25.0-29.9) 08/02/2017  . Syncope     History reviewed. No pertinent surgical history.  Current Medications: Current Meds  Medication Sig  . Biotin 29528 MCG TABS Take 1 tablet by mouth daily.  Marland Kitchen levonorgestrel (MIRENA) 20 MCG/24HR IUD 1 each by Intrauterine route once.  .  Multiple Vitamin (MULTIVITAMIN) tablet Take 1 tablet by mouth daily.     Allergies:   Patient has no known allergies.   Social History   Socioeconomic History  . Marital status: Single    Spouse name: Not on file  . Number of children: Not on file  . Years of education: Not on file  . Highest education level: Not on file  Occupational History  . Not on file  Social Needs  . Financial resource strain: Not on file  . Food insecurity:    Worry: Not on file    Inability: Not on file  . Transportation needs:    Medical: Not on file    Non-medical: Not on file  Tobacco Use  . Smoking status: Never Smoker  . Smokeless tobacco: Never Used  Substance and Sexual Activity  . Alcohol use: Yes    Comment: Occasional use  . Drug use: Not on file  . Sexual activity: Not on file  Lifestyle  . Physical activity:    Days per week: Not on file    Minutes per session: Not on file  . Stress: Not on file  Relationships  . Social connections:    Talks on phone: Not on file    Gets together: Not on file    Attends religious service: Not on file    Active member of club or organization: Not on file    Attends  meetings of clubs or organizations: Not on file    Relationship status: Not on file  Other Topics Concern  . Not on file  Social History Narrative  . Not on file     Family History: The patient's family history includes Chronic Renal Failure in her mother; Diabetes in her mother; Hypertension in her mother.  ROS:   Please see the history of present illness.    All other systems reviewed and are negative.  EKGs/Labs/Other Studies Reviewed:    The following studies were reviewed today: I discussed my findings with the patient at extensive length.   Recent Labs: 09/06/2017: ALT 24; BUN 15; Creatinine, Ser 0.67; Hemoglobin 12.2; Platelets 249; Potassium 4.2; Sodium 138 09/27/2017: TSH 0.43  Recent Lipid Panel    Component Value Date/Time   CHOL 171 09/06/2017 0822   TRIG 48  09/06/2017 0822   HDL 68 09/06/2017 0822   CHOLHDL 2.5 09/06/2017 0822   LDLCALC 93 09/06/2017 0822    Physical Exam:    VS:  BP 114/62   Pulse 79   Ht 5\' 7"  (1.702 m)   Wt 173 lb 12.8 oz (78.8 kg)   SpO2 97%   BMI 27.22 kg/m     Wt Readings from Last 3 Encounters:  10/29/17 173 lb 12.8 oz (78.8 kg)  09/27/17 177 lb 2 oz (80.3 kg)  08/29/17 172 lb (78 kg)     GEN: Patient is in no acute distress HEENT: Normal NECK: No JVD; No carotid bruits LYMPHATICS: No lymphadenopathy CARDIAC: Hear sounds regular, 2/6 systolic murmur at the apex. RESPIRATORY:  Clear to auscultation without rales, wheezing or rhonchi  ABDOMEN: Soft, non-tender, non-distended MUSCULOSKELETAL:  No edema; No deformity  SKIN: Warm and dry NEUROLOGIC:  Alert and oriented x 3 PSYCHIATRIC:  Normal affect   Signed, Garwin Brothersajan R Chayil Gantt, MD  10/29/2017 4:17 PM    [facility]

## 2017-10-29 NOTE — Patient Instructions (Signed)
Medication Instructions:  Your physician recommends that you continue on your current medications as directed. Please refer to the Current Medication list given to you today.   Labwork: None  Testing/Procedures: None  Follow-Up: Your physician recommends that you schedule a follow-up appointment in: None.   If you need a refill on your cardiac medications before your next appointment, please call your pharmacy.   Thank you for choosing [facility]! Mady Gemmaatherine Lockhart, RN [phone]

## 2017-12-14 ENCOUNTER — Ambulatory Visit: Payer: 59

## 2018-10-03 ENCOUNTER — Encounter: Payer: 59 | Admitting: Family Medicine

## 2019-06-02 DIAGNOSIS — Z3040 Encounter for surveillance of contraceptives, unspecified: Secondary | ICD-10-CM | POA: Diagnosis not present

## 2019-06-02 DIAGNOSIS — Z6826 Body mass index (BMI) 26.0-26.9, adult: Secondary | ICD-10-CM | POA: Diagnosis not present

## 2019-06-02 DIAGNOSIS — Z01419 Encounter for gynecological examination (general) (routine) without abnormal findings: Secondary | ICD-10-CM | POA: Diagnosis not present

## 2019-06-03 DIAGNOSIS — Z1231 Encounter for screening mammogram for malignant neoplasm of breast: Secondary | ICD-10-CM | POA: Diagnosis not present

## 2019-07-10 DIAGNOSIS — R928 Other abnormal and inconclusive findings on diagnostic imaging of breast: Secondary | ICD-10-CM | POA: Diagnosis not present

## 2019-07-11 ENCOUNTER — Other Ambulatory Visit: Payer: Self-pay | Admitting: Obstetrics and Gynecology

## 2019-07-11 DIAGNOSIS — N6310 Unspecified lump in the right breast, unspecified quadrant: Secondary | ICD-10-CM

## 2019-07-15 ENCOUNTER — Other Ambulatory Visit: Payer: Self-pay | Admitting: Obstetrics and Gynecology

## 2019-07-15 DIAGNOSIS — N6310 Unspecified lump in the right breast, unspecified quadrant: Secondary | ICD-10-CM

## 2019-07-18 ENCOUNTER — Other Ambulatory Visit: Payer: 59

## 2019-07-29 ENCOUNTER — Other Ambulatory Visit: Payer: Self-pay

## 2019-07-29 ENCOUNTER — Other Ambulatory Visit: Payer: Self-pay | Admitting: Obstetrics and Gynecology

## 2019-07-29 ENCOUNTER — Ambulatory Visit
Admission: RE | Admit: 2019-07-29 | Discharge: 2019-07-29 | Disposition: A | Discharge status: Home / Self Care | Payer: 59 | Source: Ambulatory Visit | Attending: Obstetrics and Gynecology | Admitting: Obstetrics and Gynecology

## 2019-07-29 DIAGNOSIS — N6310 Unspecified lump in the right breast, unspecified quadrant: Secondary | ICD-10-CM

## 2019-07-29 DIAGNOSIS — N6489 Other specified disorders of breast: Secondary | ICD-10-CM | POA: Diagnosis not present

## 2020-01-30 ENCOUNTER — Other Ambulatory Visit: Payer: 59

## 2020-06-03 DIAGNOSIS — R928 Other abnormal and inconclusive findings on diagnostic imaging of breast: Secondary | ICD-10-CM | POA: Diagnosis not present

## 2020-06-03 DIAGNOSIS — Z6829 Body mass index (BMI) 29.0-29.9, adult: Secondary | ICD-10-CM | POA: Diagnosis not present

## 2020-06-03 DIAGNOSIS — Z3040 Encounter for surveillance of contraceptives, unspecified: Secondary | ICD-10-CM | POA: Diagnosis not present

## 2020-06-03 DIAGNOSIS — Z01411 Encounter for gynecological examination (general) (routine) with abnormal findings: Secondary | ICD-10-CM | POA: Diagnosis not present

## 2020-06-03 DIAGNOSIS — L304 Erythema intertrigo: Secondary | ICD-10-CM | POA: Diagnosis not present

## 2020-06-30 ENCOUNTER — Ambulatory Visit: Payer: 59 | Admitting: Family Medicine

## 2020-07-21 ENCOUNTER — Encounter: Payer: Self-pay | Admitting: Family Medicine

## 2020-07-21 ENCOUNTER — Other Ambulatory Visit: Payer: Self-pay

## 2020-07-21 ENCOUNTER — Ambulatory Visit: Payer: 59 | Admitting: Family Medicine

## 2020-07-21 VITALS — BP 120/78 | HR 87 | Temp 98.5°F | Ht 67.0 in | Wt 196.0 lb

## 2020-07-21 DIAGNOSIS — L309 Dermatitis, unspecified: Secondary | ICD-10-CM

## 2020-07-21 MED ORDER — TRIAMCINOLONE ACETONIDE 0.1 % EX CREA: 1.0000 "application " | TOPICAL_CREAM | Freq: Two times a day (BID) | CUTANEOUS | 0 refills | Status: AC

## 2020-07-21 MED ORDER — TRIAMCINOLONE ACETONIDE 0.1 % EX CREA: 1.0000 "application " | TOPICAL_CREAM | Freq: Two times a day (BID) | CUTANEOUS | 0 refills | Status: DC

## 2020-07-21 NOTE — Patient Instructions (Addendum)
Ice/cold pack over area for 10-15 min twice daily.  Try not to itch.   Use the new cream twice daily for 14 days.   Consider an oral antihistamine to help with itching. Xyzal, Zyrtec or Claritin are good options.  Avoid scented products while this is going on.  Put on Cetaphil after applying the cream.  Consider placing Saran wrap on your hands at night if things are not progressing as nicely as we had hoped.  Let me know if things worsen.  Let us know if you need anything.

## 2020-07-21 NOTE — Progress Notes (Signed)
Chief Complaint  Patient presents with  . Rash    Rash on both hands     Maria Peters is a 50 y.o. female here for a skin complaint.  Duration: 2 months Location: back of hands Pruritic? Yes Painful? No Drainage? No New soaps/lotions/topicals/detergents? No Sick contacts? No Other associated symptoms: no fevers, it is spreading Therapies tried thus far: lotion, HC cream, anti-fungal cream  Past Medical History:  Diagnosis Date  . Overweight (BMI 25.0-29.9) 08/02/2017  . Syncope     BP 120/78 (BP Location: Left Arm, Patient Position: Sitting, Cuff Size: Normal)   Pulse 87   Temp 98.5 F (36.9 C) (Oral)   Ht 5\' 7"  (1.702 m)   Wt 196 lb (88.9 kg)   SpO2 99%   BMI 30.70 kg/m  Gen: awake, alert, appearing stated age Lungs: No accessory muscle use Skin: See below. No drainage, erythema, TTP, fluctuance, excoriation Psych: Age appropriate judgment and insight      Dermatitis - Plan: triamcinolone cream (KENALOG) 0.1 %  Bid use for 14 d. Cont Cetaphil. Consider OTC po antihist. Don't itch. Ice. Avoid scented products. Could saran wrap at night.  F/u prn. The patient voiced understanding and agreement to the plan.  [place], DO 07/21/20 9:55 AM

## 2020-11-15 DIAGNOSIS — Z113 Encounter for screening for infections with a predominantly sexual mode of transmission: Secondary | ICD-10-CM | POA: Diagnosis not present

## 2020-11-15 DIAGNOSIS — Z30430 Encounter for insertion of intrauterine contraceptive device: Secondary | ICD-10-CM | POA: Diagnosis not present

## 2020-11-15 DIAGNOSIS — Z30432 Encounter for removal of intrauterine contraceptive device: Secondary | ICD-10-CM | POA: Diagnosis not present

## 2021-06-07 DIAGNOSIS — Z6830 Body mass index (BMI) 30.0-30.9, adult: Secondary | ICD-10-CM | POA: Diagnosis not present

## 2021-06-07 DIAGNOSIS — Z3040 Encounter for surveillance of contraceptives, unspecified: Secondary | ICD-10-CM | POA: Diagnosis not present

## 2021-06-07 DIAGNOSIS — B3731 Acute candidiasis of vulva and vagina: Secondary | ICD-10-CM | POA: Diagnosis not present

## 2021-06-07 DIAGNOSIS — N898 Other specified noninflammatory disorders of vagina: Secondary | ICD-10-CM | POA: Diagnosis not present

## 2021-06-07 DIAGNOSIS — Z1231 Encounter for screening mammogram for malignant neoplasm of breast: Secondary | ICD-10-CM | POA: Diagnosis not present

## 2021-06-07 DIAGNOSIS — Z01411 Encounter for gynecological examination (general) (routine) with abnormal findings: Secondary | ICD-10-CM | POA: Diagnosis not present

## 2021-12-22 IMAGING — US US BREAST*R* LIMITED INC AXILLA
1 series · 7 of 7 positions shown · non-contrast
Comparison: Previous exams.

CLINICAL DATA: 49-year-old female with recent outside imaging
demonstrating an asymmetry in the retroareolar right breast
visualized on the MLO view with subsequent targeted ultrasound
demonstrating a vague hypoechoic area at the 11-12 o'clock
retroareolar position for which ultrasound-guided core biopsy was
recommended.

EXAM:
ULTRASOUND OF THE RIGHT BREAST

[Series 1: us breast*right* limited inc axilla · 0.07mm/px · 7 of 7 slices shown]
[im 1/7]
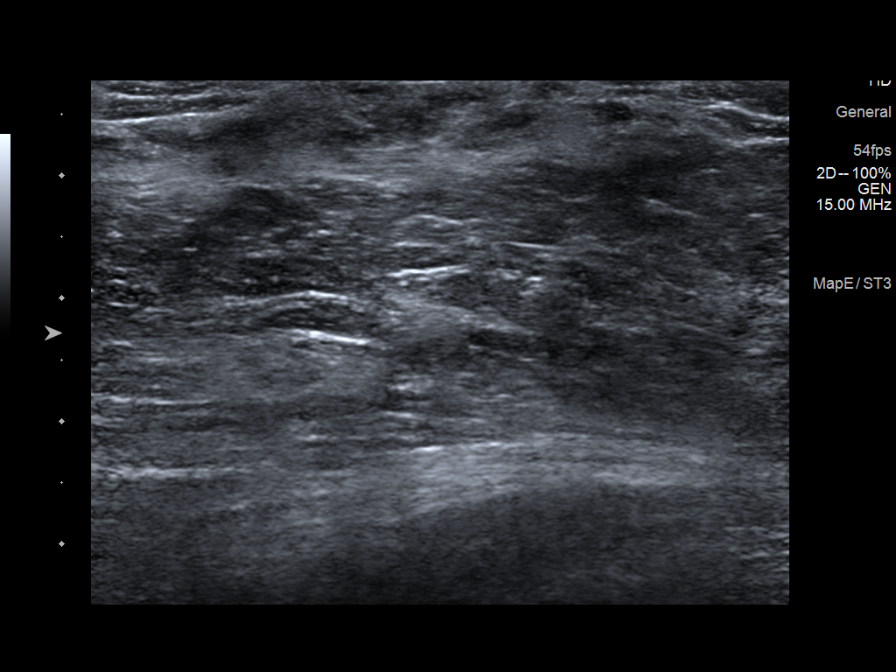
[im 2/7]
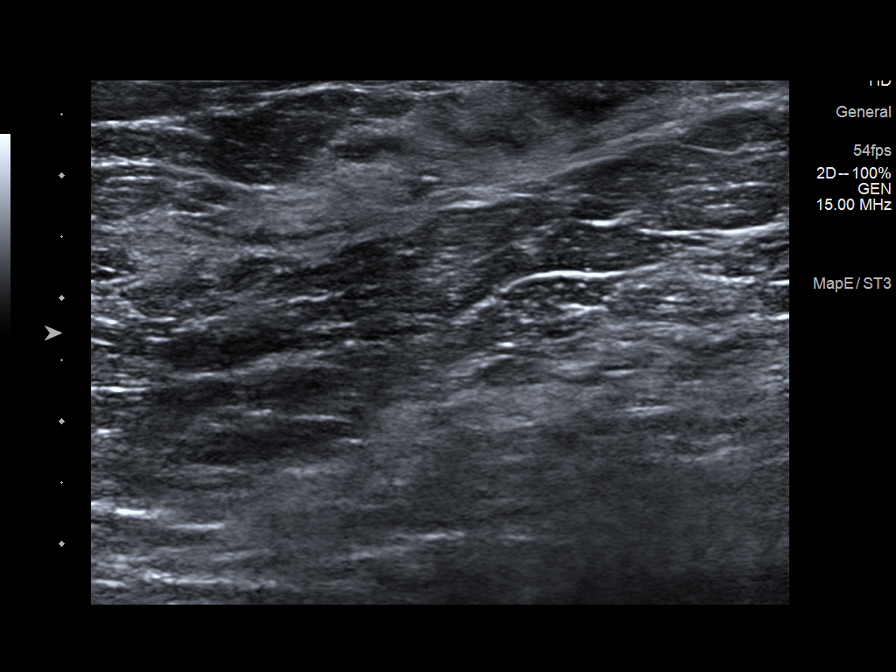
[im 3/7]
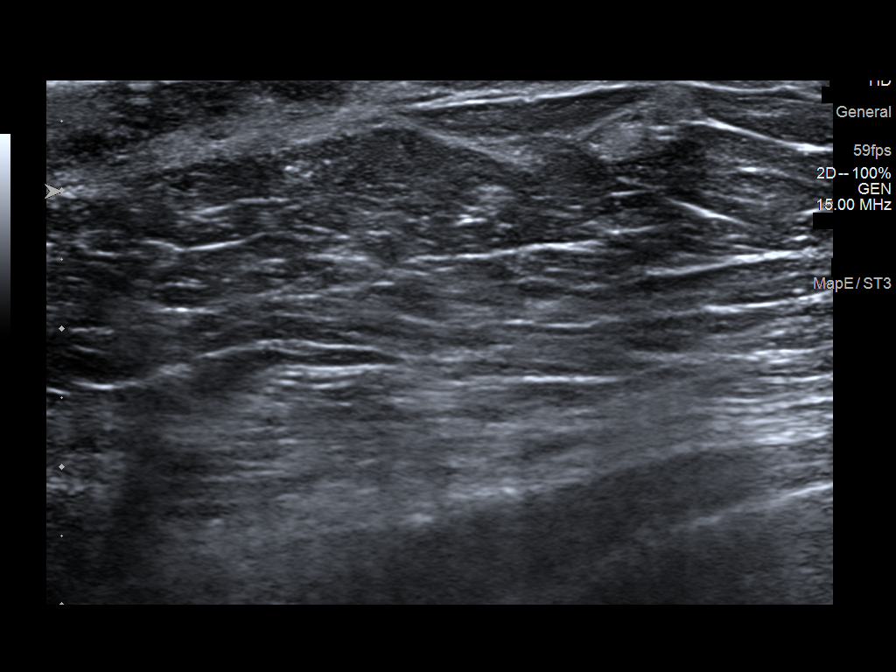
[im 4/7]
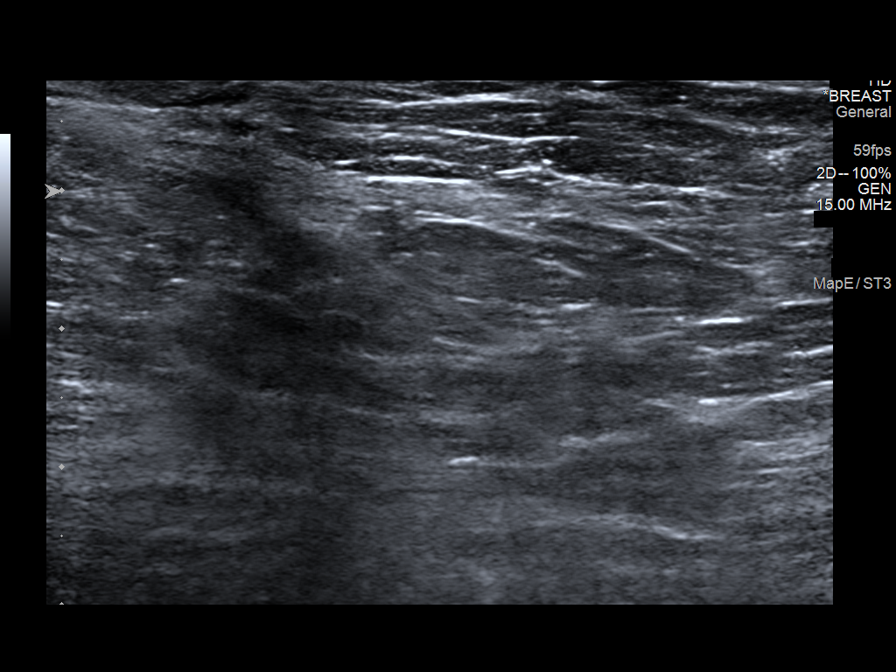
[im 5/7]
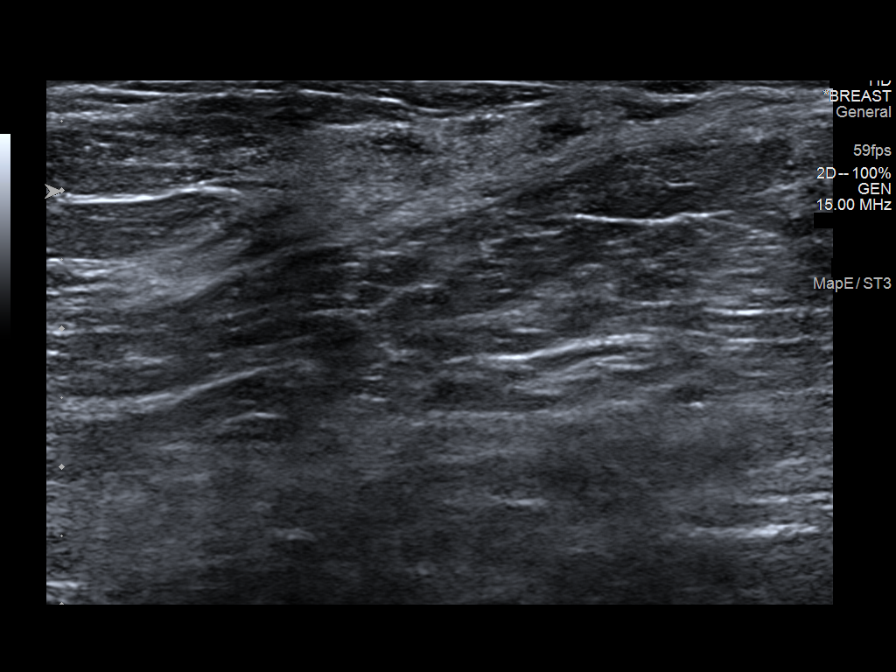
[im 6/7]
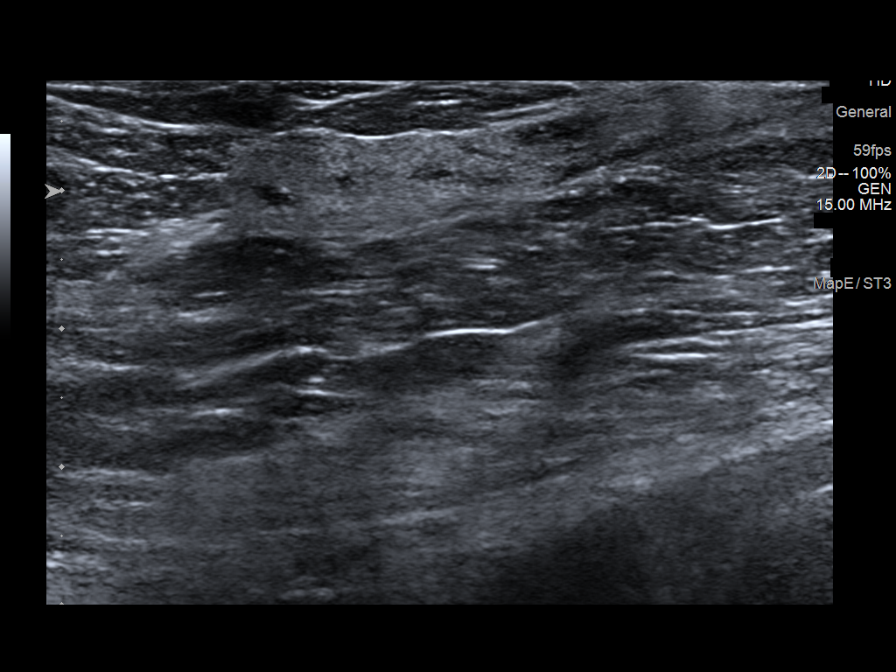
[im 7/7]
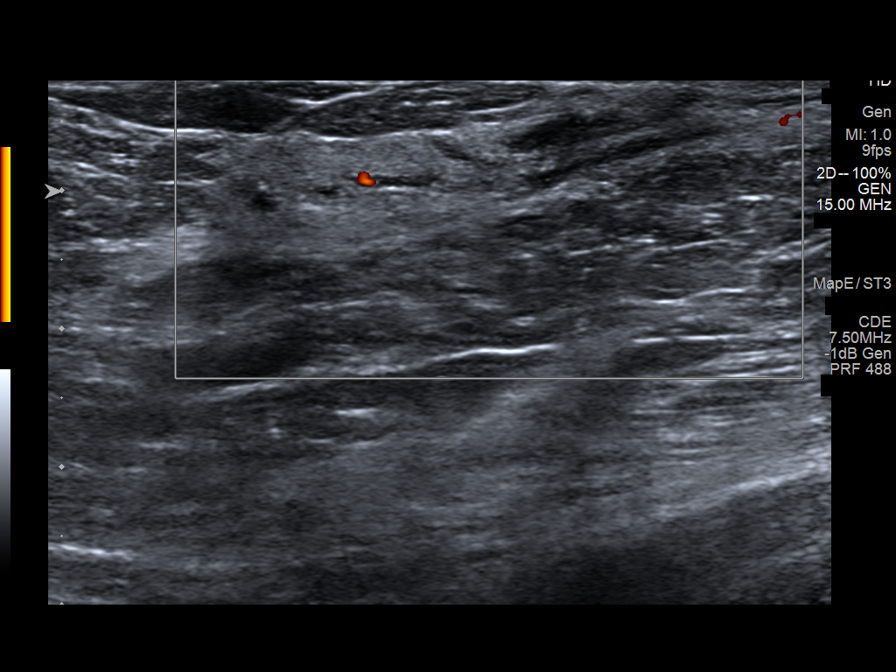

[7 of 7 positions shown; findings below may reference images not displayed]

FINDINGS: Review of patient's recent breast imaging studies demonstrates an
asymmetry in the retroareolar right breast, possibly related to an
area of dense fibroglandular tissue and overall not significantly
changed in appearance dating back to prior mammograms from
07/19/2007.

Targeted ultrasound of the entire central right breast was
performed. No suspicious masses or abnormality seen, only a dense
band of fibroglandular tissue identified in the retroareolar right
breast. The previously identified vague hypoechoic area in the right
breast at the 11 o'clock retroareolar position corresponds with
normal/heterogeneous fibroglandular tissue. There are no sonographic
findings of malignancy.
IMPRESSION: No mass identified in the retroareolar right breast to target for
biopsy. This initially questioned possible mass is felt to
correspond with dense/heterogeneous fibroglandular tissue. There are
no findings of malignancy in the right breast.

RECOMMENDATION:
As a precaution, recommend six-month follow-up diagnostic
mammography of the right breast. The findings and recommendations
have been discussed with the patient who demonstrates understanding
and agrees with this plan.

I have discussed the findings and recommendations with the patient.
If applicable, a reminder letter will be sent to the patient
regarding the next appointment.

BI-RADS CATEGORY  3: Probably benign.

## 2023-07-06 ENCOUNTER — Encounter: Payer: Self-pay | Admitting: Family Medicine

## 2023-07-06 ENCOUNTER — Ambulatory Visit: Admitting: Family Medicine

## 2023-07-06 VITALS — BP 120/78 | HR 75 | Temp 98.4°F | Ht 67.0 in | Wt 207.4 lb

## 2023-07-06 DIAGNOSIS — J209 Acute bronchitis, unspecified: Secondary | ICD-10-CM | POA: Diagnosis not present

## 2023-07-06 MED ORDER — AZITHROMYCIN 250 MG PO TABS: ORAL_TABLET | ORAL | 0 refills | Status: AC

## 2023-07-06 MED ORDER — BENZONATATE 200 MG PO CAPS: 200.0000 mg | ORAL_CAPSULE | Freq: Two times a day (BID) | ORAL | 0 refills | Status: AC | PRN

## 2023-07-06 MED ORDER — FLUTICASONE PROPIONATE 50 MCG/ACT NA SUSP
2.0000 | Freq: Every day | NASAL | 6 refills | Status: AC
  Filled 2023-08-13: qty 16, 30d supply, fill #0
  Filled 2023-12-13: qty 16, 30d supply, fill #1
  Filled 2024-03-12: qty 16, 30d supply, fill #2

## 2023-07-06 NOTE — Patient Instructions (Addendum)
 Continue to push fluids, practice good hand hygiene, and cover your mouth if you cough.  If you start having fevers, shaking or shortness of breath, seek immediate care.  OK to take Tylenol 1000 mg (2 extra strength tabs) or 975 mg (3 regular strength tabs) every 6 hours as needed.  If not continuing to improve with symptoms, take the antibiotic.   Flonase (fluticasone); nasal spray that is over the counter. 2 sprays each nostril, once daily. Aim towards the same side eye when you spray.  Let us know if you need anything.

## 2023-07-06 NOTE — Progress Notes (Signed)
 Chief Complaint  Patient presents with   Acute Visit    Patient presents today for congestion and cough for 3 weeks now.    Maria Peters here for URI complaints.  Duration: 3 weeks- slightly better Associated symptoms: sinus congestion, rhinorrhea, and coughing Denies: sinus pain, itchy watery eyes, ear pain, ear drainage, sore throat, wheezing, shortness of breath, myalgia, and fevers Treatment to date: none Sick contacts: No  Past Medical History:  Diagnosis Date   Overweight (BMI 25.0-29.9) 08/02/2017   Syncope     Objective BP 120/78   Pulse 75   Temp 98.4 F (36.9 C)   Ht 5\' 7"  (1.702 m)   Wt 207 lb 6.4 oz (94.1 kg)   SpO2 97%   BMI 32.48 kg/m  General: Awake, alert, appears stated age HEENT: AT, [place], ears patent b/l and TM's neg, nares patent w/ clear b/l discharge, right turbinate edema noted, pharynx pink and without exudates, MMM, no sinus ttp Neck: No masses or asymmetry Heart: RRR Lungs: CTAB, no accessory muscle use Psych: Age appropriate judgment and insight, normal mood and affect  Acute bronchitis, unspecified organism - Plan: benzonatate (TESSALON) 200 MG capsule  Probably viral or resolving bacterial infection.  Tessalon Perles as needed.  Flonase.  If she fails to continue improvement, take a Z-Pak.  Continue to push fluids, practice good hand hygiene, cover mouth when coughing. F/u prn. If starting to experience fevers, shaking, or shortness of breath, seek immediate care. Pt voiced understanding and agreement to the plan.  Jilda Roche [place], DO 07/06/23 4:24 PM

## 2023-07-08 ENCOUNTER — Encounter: Payer: Self-pay | Admitting: Family Medicine

## 2023-08-10 ENCOUNTER — Other Ambulatory Visit (HOSPITAL_COMMUNITY): Payer: Self-pay

## 2023-08-13 ENCOUNTER — Other Ambulatory Visit (HOSPITAL_COMMUNITY): Payer: Self-pay

## 2023-08-13 ENCOUNTER — Other Ambulatory Visit: Payer: Self-pay

## 2023-08-14 ENCOUNTER — Other Ambulatory Visit (HOSPITAL_COMMUNITY): Payer: Self-pay

## 2023-09-13 ENCOUNTER — Other Ambulatory Visit: Payer: Self-pay | Admitting: Obstetrics & Gynecology

## 2023-09-13 DIAGNOSIS — Z1231 Encounter for screening mammogram for malignant neoplasm of breast: Secondary | ICD-10-CM

## 2023-09-27 ENCOUNTER — Other Ambulatory Visit: Payer: Self-pay | Admitting: Obstetrics & Gynecology

## 2023-09-27 ENCOUNTER — Ambulatory Visit
Admission: RE | Admit: 2023-09-27 | Discharge: 2023-09-27 | Disposition: A | Discharge status: Home / Self Care | Source: Ambulatory Visit | Attending: Obstetrics & Gynecology | Admitting: Obstetrics & Gynecology

## 2023-09-27 DIAGNOSIS — R92321 Mammographic fibroglandular density, right breast: Secondary | ICD-10-CM

## 2023-09-27 DIAGNOSIS — Z1231 Encounter for screening mammogram for malignant neoplasm of breast: Secondary | ICD-10-CM
